# Patient Record
Sex: Female | Born: 2000 | Hispanic: No | Marital: Single | Smoking: Never smoker
Health system: Southern US, Community
[De-identification: ages and names within clinical notes are randomized; demographics above are authoritative.]

---
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
## 2021-06-11 ENCOUNTER — Ambulatory Visit (INDEPENDENT_AMBULATORY_CARE_PROVIDER_SITE_OTHER): Payer: Medicaid Other | Admitting: Adult Health

## 2021-06-11 ENCOUNTER — Encounter: Payer: Self-pay | Admitting: Adult Health

## 2021-06-11 ENCOUNTER — Other Ambulatory Visit: Payer: Self-pay

## 2021-06-11 VITALS — BP 105/71 | HR 82 | Ht 61.5 in | Wt 102.5 lb

## 2021-06-11 DIAGNOSIS — Z113 Encounter for screening for infections with a predominantly sexual mode of transmission: Secondary | ICD-10-CM

## 2021-06-11 DIAGNOSIS — Z3202 Encounter for pregnancy test, result negative: Secondary | ICD-10-CM | POA: Insufficient documentation

## 2021-06-11 DIAGNOSIS — Z30013 Encounter for initial prescription of injectable contraceptive: Secondary | ICD-10-CM

## 2021-06-11 DIAGNOSIS — N946 Dysmenorrhea, unspecified: Secondary | ICD-10-CM

## 2021-06-11 LAB — POCT URINE PREGNANCY: Preg Test, Ur: NEGATIVE

## 2021-06-11 MED ORDER — MEDROXYPROGESTERONE ACETATE 150 MG/ML IM SUSP: 150.0000 mg | INTRAMUSCULAR | 4 refills | Status: DC

## 2021-06-11 MED ORDER — MEDROXYPROGESTERONE ACETATE 150 MG/ML IM SUSP
150.0000 mg | Freq: Once | INTRAMUSCULAR | Status: AC
  Administered 2021-06-11: 150 mg via INTRAMUSCULAR

## 2021-06-11 NOTE — Addendum Note (Signed)
Addended by: Colen Darling on: 06/11/2021 05:20 PM   Modules accepted: Orders

## 2021-06-11 NOTE — Progress Notes (Signed)
  Subjective:     Patient ID: Mercedes Allen, female   DOB: 13-Nov-2001, 20 y.o.   MRN: [id number]  HPI Diamonds is a 20 year old white female,single, G0P0, in complaining of painful periods, breast lumps and wants to discuss birth control. PCP is Dr Azucena Kuba.   Review of Systems Has painful periods with nausea and vomiting at times Started at age 66 Periods last 6-7 days Not currently sexually active, but has had sex Has breast lumps since about 6th grade Reviewed past medical,surgical, social and family history. Reviewed medications and allergies.     Objective:   Physical Exam BP 105/71 (BP Location: Left Arm, Patient Position: Sitting, Cuff Size: Normal)   Pulse 82   Ht 5' 1.5" (1.562 m)   Wt 102 lb 8 oz (46.5 kg)   LMP 06/02/2021   BMI 19.05 kg/m  UPT is negative  Skin warm and dry. Neck: mid line trachea, normal thyroid, good ROM, no lymphadenopathy noted. Lungs: clear to ausculation bilaterally. Cardiovascular: regular rate and rhythm.  Breasts:no dominate palpable mass, retraction or nipple discharge, has bilateral irregular breast tissue AA is 0 Fall risk is low Depression screen PHQ 2/9 06/11/2021  Decreased Interest 2  Down, Depressed, Hopeless 0  PHQ - 2 Score 2  Altered sleeping 1  Tired, decreased energy 1  Change in appetite 0  Feeling bad or failure about yourself  0  Trouble concentrating 0  Moving slowly or fidgety/restless 0  Suicidal thoughts 0  PHQ-9 Score 4    GAD 7 : Generalized Anxiety Score 06/11/2021  Nervous, Anxious, on Edge 1  Control/stop worrying 1  Worry too much - different things 1  Trouble relaxing 0  Restless 0  Easily annoyed or irritable 1  Afraid - awful might happen 0  Total GAD 7 Score 4      Upstream - 06/11/21 1523       Pregnancy Intention Screening   Does the patient want to become pregnant in the next year? No    Does the patient's partner want to become pregnant in the next year? No    Would the patient like to discuss  contraceptive options today? Yes      Contraception Wrap Up   Current Method Abstinence    End Method Female Condom;Hormonal Injection    Contraception Counseling Provided Yes                Assessment:     1. Pregnancy examination or test, negative result   2. Screening examination for STD (sexually transmitted disease) Urine sent for GC/CHL  3. Dysmenorrhea Will start depo  4. Encounter for initial prescription of injectable contraceptive Discussed options, and she wants depo  Will give first depo today in office, will rx depo Meds ordered this encounter  Medications   medroxyPROGESTERone (DEPO-PROVERA) 150 MG/ML injection    Sig: Inject 1 mL (150 mg total) into the muscle every 3 (three) months.    Dispense:  1 mL    Refill:  4    Order Specific Question:   Supervising Provider    Answer:   Lazaro Arms [[id number]]       Plan:     Return in 12 weeks for depo

## 2021-06-14 LAB — GC/CHLAMYDIA PROBE AMP
Chlamydia trachomatis, NAA: NEGATIVE
Neisseria Gonorrhoeae by PCR: NEGATIVE

## 2021-06-16 ENCOUNTER — Telehealth: Payer: Self-pay

## 2021-06-16 NOTE — Telephone Encounter (Signed)
Called pt to inform her of test results. Pt confirmed understanding. 

## 2021-07-10 ENCOUNTER — Other Ambulatory Visit: Payer: Self-pay

## 2021-09-03 ENCOUNTER — Ambulatory Visit (INDEPENDENT_AMBULATORY_CARE_PROVIDER_SITE_OTHER): Payer: Medicaid Other | Admitting: *Deleted

## 2021-09-03 ENCOUNTER — Other Ambulatory Visit: Payer: Self-pay

## 2021-09-03 DIAGNOSIS — Z30013 Encounter for initial prescription of injectable contraceptive: Secondary | ICD-10-CM

## 2021-09-03 DIAGNOSIS — Z3042 Encounter for surveillance of injectable contraceptive: Secondary | ICD-10-CM | POA: Diagnosis not present

## 2021-09-03 MED ORDER — MEDROXYPROGESTERONE ACETATE 150 MG/ML IM SUSP
150.0000 mg | Freq: Once | INTRAMUSCULAR | Status: AC
  Administered 2021-09-03: 150 mg via INTRAMUSCULAR

## 2021-09-03 NOTE — Progress Notes (Signed)
   NURSE VISIT- INJECTION  SUBJECTIVE:  Mercedes Allen is a 20 y.o. G0P0000 female here for a Depo Provera for contraception/period management. She is a GYN patient.   OBJECTIVE:  There were no vitals taken for this visit.  Appears well, in no apparent distress  Injection administered in: Right deltoid  Meds ordered this encounter  Medications   medroxyPROGESTERone (DEPO-PROVERA) injection 150 mg    ASSESSMENT: GYN patient Depo Provera for contraception/period management PLAN: Follow-up: in 11-13 weeks for next Depo   Annamarie Dawley  09/03/2021 3:04 PM

## 2021-11-26 ENCOUNTER — Ambulatory Visit (INDEPENDENT_AMBULATORY_CARE_PROVIDER_SITE_OTHER): Payer: Medicaid Other | Admitting: *Deleted

## 2021-11-26 ENCOUNTER — Other Ambulatory Visit: Payer: Self-pay

## 2021-11-26 DIAGNOSIS — Z3042 Encounter for surveillance of injectable contraceptive: Secondary | ICD-10-CM

## 2021-11-26 MED ORDER — MEDROXYPROGESTERONE ACETATE 150 MG/ML IM SUSP
150.0000 mg | Freq: Once | INTRAMUSCULAR | Status: AC
  Administered 2021-11-26: 150 mg via INTRAMUSCULAR

## 2021-11-26 NOTE — Progress Notes (Signed)
° °  NURSE VISIT- INJECTION  SUBJECTIVE:  Mercedes Allen is a 21 y.o. G0P0000 female here for a Depo Provera for contraception/period management. She is a GYN patient.   OBJECTIVE:  There were no vitals taken for this visit.  Appears well, in no apparent distress  Injection administered in: Left deltoid  Meds ordered this encounter  Medications   medroxyPROGESTERone (DEPO-PROVERA) injection 150 mg    ASSESSMENT: GYN patient Depo Provera for contraception/period management PLAN: Follow-up: in 11-13 weeks for next Depo   Mercedes Allen  11/26/2021 4:03 PM

## 2022-02-11 ENCOUNTER — Ambulatory Visit (INDEPENDENT_AMBULATORY_CARE_PROVIDER_SITE_OTHER): Payer: Medicaid Other | Admitting: *Deleted

## 2022-02-11 DIAGNOSIS — Z3202 Encounter for pregnancy test, result negative: Secondary | ICD-10-CM | POA: Diagnosis not present

## 2022-02-11 DIAGNOSIS — Z3042 Encounter for surveillance of injectable contraceptive: Secondary | ICD-10-CM | POA: Diagnosis not present

## 2022-02-11 LAB — POCT URINE PREGNANCY: Preg Test, Ur: NEGATIVE

## 2022-02-11 MED ORDER — MEDROXYPROGESTERONE ACETATE 150 MG/ML IM SUSP
150.0000 mg | Freq: Once | INTRAMUSCULAR | Status: AC
  Administered 2022-02-11: 150 mg via INTRAMUSCULAR

## 2022-02-11 NOTE — Progress Notes (Signed)
? ?  NURSE VISIT- INJECTION ? ?SUBJECTIVE:  ?Mercedes Allen is a 21 y.o. G0P0000 female here for a Depo Provera for contraception/period management. She is a GYN patient. Pt has noticed tender breasts. UPT negative in office. Advised it's probably hormonal.  ? ?OBJECTIVE:  ?There were no vitals taken for this visit.  ?Appears well, in no apparent distress ? ?Injection administered in: Right deltoid ? ?Meds ordered this encounter  ?Medications  ? medroxyPROGESTERone (DEPO-PROVERA) injection 150 mg  ? ? ?ASSESSMENT: ?GYN patient Depo Provera for contraception/period management ?PLAN: ?Follow-up: in 11-13 weeks for next Depo  ? ?Malachy Mood  ?02/11/2022 ?4:33 PM ? ?

## 2022-05-04 ENCOUNTER — Ambulatory Visit (INDEPENDENT_AMBULATORY_CARE_PROVIDER_SITE_OTHER): Payer: Medicaid Other | Admitting: *Deleted

## 2022-05-04 DIAGNOSIS — Z3042 Encounter for surveillance of injectable contraceptive: Secondary | ICD-10-CM | POA: Diagnosis not present

## 2022-05-04 MED ORDER — MEDROXYPROGESTERONE ACETATE 150 MG/ML IM SUSP
150.0000 mg | Freq: Once | INTRAMUSCULAR | Status: AC
  Administered 2022-05-04: 150 mg via INTRAMUSCULAR

## 2022-05-04 NOTE — Progress Notes (Signed)
   NURSE VISIT- INJECTION  SUBJECTIVE:  Mercedes Allen is a 21 y.o. G0P0000 female here for a Depo Provera for contraception/period management. She is a GYN patient.   OBJECTIVE:  There were no vitals taken for this visit.  Appears well, in no apparent distress  Injection administered in: Left deltoid  Meds ordered this encounter  Medications   medroxyPROGESTERone (DEPO-PROVERA) injection 150 mg    ASSESSMENT: GYN patient Depo Provera for contraception/period management PLAN: Follow-up: in 11-13 weeks for next Depo   Jobe Marker  05/04/2022 4:39 PM

## 2022-07-28 ENCOUNTER — Ambulatory Visit: Payer: Medicaid Other

## 2022-07-29 ENCOUNTER — Ambulatory Visit (INDEPENDENT_AMBULATORY_CARE_PROVIDER_SITE_OTHER): Payer: Medicaid Other | Admitting: *Deleted

## 2022-07-29 ENCOUNTER — Other Ambulatory Visit: Payer: Self-pay | Admitting: Adult Health

## 2022-07-29 DIAGNOSIS — Z3042 Encounter for surveillance of injectable contraceptive: Secondary | ICD-10-CM

## 2022-07-29 MED ORDER — MEDROXYPROGESTERONE ACETATE 150 MG/ML IM SUSP
150.0000 mg | Freq: Once | INTRAMUSCULAR | Status: AC
  Administered 2022-07-29: 150 mg via INTRAMUSCULAR

## 2022-07-29 NOTE — Progress Notes (Signed)
   NURSE VISIT- INJECTION  SUBJECTIVE:  Mercedes Allen is a 21 y.o. G0P0000 female here for a Depo Provera for contraception/period management. She is a GYN patient.   OBJECTIVE:  There were no vitals taken for this visit.  Appears well, in no apparent distress  Injection administered in: Right deltoid  Meds ordered this encounter  Medications   medroxyPROGESTERone (DEPO-PROVERA) injection 150 mg    ASSESSMENT: GYN patient Depo Provera for contraception/period management PLAN: Follow-up: in 11-13 weeks for next Depo   Annamarie Dawley  07/29/2022 3:26 PM

## 2022-10-21 ENCOUNTER — Ambulatory Visit: Payer: Medicaid Other

## 2022-12-09 ENCOUNTER — Encounter: Payer: Self-pay | Admitting: Adult Health

## 2022-12-09 ENCOUNTER — Ambulatory Visit (INDEPENDENT_AMBULATORY_CARE_PROVIDER_SITE_OTHER): Payer: Medicaid Other | Admitting: Adult Health

## 2022-12-09 VITALS — BP 102/67 | HR 72 | Ht 61.5 in | Wt 98.0 lb

## 2022-12-09 DIAGNOSIS — Z3202 Encounter for pregnancy test, result negative: Secondary | ICD-10-CM | POA: Diagnosis not present

## 2022-12-09 DIAGNOSIS — Z30013 Encounter for initial prescription of injectable contraceptive: Secondary | ICD-10-CM | POA: Diagnosis not present

## 2022-12-09 DIAGNOSIS — Z113 Encounter for screening for infections with a predominantly sexual mode of transmission: Secondary | ICD-10-CM | POA: Diagnosis not present

## 2022-12-09 LAB — POCT URINE PREGNANCY: Preg Test, Ur: NEGATIVE

## 2022-12-09 MED ORDER — MEDROXYPROGESTERONE ACETATE 150 MG/ML IM SUSP: 150.0000 mg | INTRAMUSCULAR | 4 refills | Status: AC

## 2022-12-09 MED ORDER — MEDROXYPROGESTERONE ACETATE 150 MG/ML IM SUSP
150.0000 mg | Freq: Once | INTRAMUSCULAR | Status: AC
  Administered 2022-12-09: 150 mg via INTRAMUSCULAR

## 2022-12-09 NOTE — Addendum Note (Signed)
Addended by: Linton Rump on: 12/09/2022 10:03 AM   Modules accepted: Orders

## 2022-12-09 NOTE — Progress Notes (Addendum)
  Subjective:     Patient ID: Lawrence Santiago, female   DOB: May 19, 2001, 22 y.o.   MRN: [id number]  HPI Ambreen is a 22 year old biracial female, single, G0P0, in to get back on depo. Last sex before period.  PCP is Dr Joneen Caraway  Review of Systems Patient denies any headaches, hearing loss, fatigue, blurred vision, shortness of breath, chest pain, abdominal pain, problems with bowel movements, urination, or intercourse. No joint pain or mood swings.     Objective:   Physical Exam BP 102/67 (BP Location: Left Arm, Patient Position: Sitting, Cuff Size: Normal)   Pulse 72   Ht 5' 1.5" (1.562 m)   Wt 98 lb (44.5 kg)   LMP 12/01/2022   BMI 18.22 kg/m     UPT is negative Skin warm and dry.  Lungs: clear to ausculation bilaterally. Cardiovascular: regular rate and rhythm.  Fall risk is low  Upstream - 12/09/22 0931       Pregnancy Intention Screening   Does the patient want to become pregnant in the next year? No    Does the patient's partner want to become pregnant in the next year? No    Would the patient like to discuss contraceptive options today? No      Contraception Wrap Up   Current Method Female Condom    End Method Hormonal Injection    Contraception Counseling Provided Yes    How was the end contraceptive method provided? Provided on site   rx sent too            Assessment:     1. Pregnancy examination or test, negative result  2. Screening examination for STD (sexually transmitted disease) Urine sent for GC/CHL  3. Encounter for initial prescription of injectable contraceptive Will give depo in office today and rx has been sent Use condoms for 2 weeks  Meds ordered this encounter  Medications   medroxyPROGESTERone (DEPO-PROVERA) 150 MG/ML injection    Sig: Inject 1 mL (150 mg total) into the muscle every 3 (three) months.    Dispense:  1 mL    Refill:  4    Order Specific Question:   Supervising Provider    Answer:   Florian Buff [[id number]]       Plan:      Follow up in 12 weeks for depo Return about 08/16/23 for first pap and physical

## 2022-12-12 LAB — GC/CHLAMYDIA PROBE AMP
Chlamydia trachomatis, NAA: NEGATIVE
Neisseria Gonorrhoeae by PCR: NEGATIVE

## 2023-03-03 ENCOUNTER — Ambulatory Visit (INDEPENDENT_AMBULATORY_CARE_PROVIDER_SITE_OTHER): Payer: Medicaid Other | Admitting: *Deleted

## 2023-03-03 DIAGNOSIS — Z3042 Encounter for surveillance of injectable contraceptive: Secondary | ICD-10-CM

## 2023-03-03 MED ORDER — MEDROXYPROGESTERONE ACETATE 150 MG/ML IM SUSP
150.0000 mg | Freq: Once | INTRAMUSCULAR | Status: AC
  Administered 2023-03-03: 150 mg via INTRAMUSCULAR

## 2023-03-03 NOTE — Progress Notes (Signed)
   NURSE VISIT- INJECTION  SUBJECTIVE:  Mercedes Allen is a 22 y.o. G0P0000 female here for a Depo Provera for contraception/period management. She is a GYN patient.   OBJECTIVE:  There were no vitals taken for this visit.  Appears well, in no apparent distress  Injection administered in: Left deltoid  Meds ordered this encounter  Medications   medroxyPROGESTERone (DEPO-PROVERA) injection 150 mg    ASSESSMENT: GYN patient Depo Provera for contraception/period management PLAN: Follow-up: in 11-13 weeks for next Depo   Annamarie Dawley  03/03/2023 10:53 AM

## 2023-05-26 ENCOUNTER — Ambulatory Visit: Payer: Self-pay

## 2023-05-27 ENCOUNTER — Ambulatory Visit (INDEPENDENT_AMBULATORY_CARE_PROVIDER_SITE_OTHER): Payer: Self-pay | Admitting: *Deleted

## 2023-05-27 DIAGNOSIS — Z3042 Encounter for surveillance of injectable contraceptive: Secondary | ICD-10-CM

## 2023-05-27 MED ORDER — MEDROXYPROGESTERONE ACETATE 150 MG/ML IM SUSP
150.0000 mg | Freq: Once | INTRAMUSCULAR | Status: AC
  Administered 2023-05-27: 150 mg via INTRAMUSCULAR

## 2023-05-27 NOTE — Progress Notes (Signed)
   NURSE VISIT- INJECTION  SUBJECTIVE:  Mercedes Allen is a 22 y.o. G0P0000 female here for a Depo Provera for contraception/period management. She is a GYN patient.   OBJECTIVE:  There were no vitals taken for this visit.  Appears well, in no apparent distress  Injection administered in: Right deltoid  Meds ordered this encounter  Medications   medroxyPROGESTERone (DEPO-PROVERA) injection 150 mg    ASSESSMENT: GYN patient Depo Provera for contraception/period management PLAN: Follow-up: in 11-13 weeks for next Depo   Jobe Marker  05/27/2023 11:20 AM

## 2023-08-19 ENCOUNTER — Ambulatory Visit (INDEPENDENT_AMBULATORY_CARE_PROVIDER_SITE_OTHER): Payer: Self-pay | Admitting: *Deleted

## 2023-08-19 DIAGNOSIS — Z3042 Encounter for surveillance of injectable contraceptive: Secondary | ICD-10-CM

## 2023-08-19 MED ORDER — MEDROXYPROGESTERONE ACETATE 150 MG/ML IM SUSY
150.0000 mg | PREFILLED_SYRINGE | Freq: Once | INTRAMUSCULAR | Status: AC
  Administered 2023-08-19: 150 mg via INTRAMUSCULAR

## 2023-08-19 NOTE — Progress Notes (Signed)
   NURSE VISIT- INJECTION  SUBJECTIVE:  Mercedes Allen is a 22 y.o. G0P0000 female here for a Depo Provera for contraception/period management. She is a GYN patient.   OBJECTIVE:  There were no vitals taken for this visit.  Appears well, in no apparent distress  Injection administered in: Left deltoid  Meds ordered this encounter  Medications   medroxyPROGESTERone Acetate SUSY 150 mg    ASSESSMENT: GYN patient Depo Provera for contraception/period management PLAN: Follow-up: in 11-13 weeks for next Depo   Malachy Mood  08/19/2023 10:30 AM

## 2023-11-12 ENCOUNTER — Ambulatory Visit: Payer: Self-pay
